# Patient Record
Sex: Male | Born: 1975 | Race: Black or African American | Hispanic: Yes | Marital: Single | Smoking: Current every day smoker
Health system: Southern US, Community
[De-identification: ages and names within clinical notes are randomized; demographics above are authoritative.]

---

## 2010-08-16 ENCOUNTER — Emergency Department (HOSPITAL_COMMUNITY)
Admission: EM | Admit: 2010-08-16 | Discharge: 2010-08-16 | Disposition: A | Discharge status: Home / Self Care | Payer: No Typology Code available for payment source | Attending: Emergency Medicine | Admitting: Emergency Medicine

## 2010-08-16 DIAGNOSIS — M542 Cervicalgia: Secondary | ICD-10-CM | POA: Insufficient documentation

## 2010-08-16 DIAGNOSIS — M545 Low back pain, unspecified: Secondary | ICD-10-CM | POA: Insufficient documentation

## 2010-08-16 DIAGNOSIS — S335XXA Sprain of ligaments of lumbar spine, initial encounter: Secondary | ICD-10-CM | POA: Insufficient documentation

## 2014-10-22 ENCOUNTER — Encounter: Payer: Self-pay | Admitting: Emergency Medicine

## 2014-10-22 ENCOUNTER — Emergency Department
Admission: EM | Admit: 2014-10-22 | Discharge: 2014-10-22 | Disposition: A | Discharge status: Home / Self Care | Payer: BLUE CROSS/BLUE SHIELD | Attending: Emergency Medicine | Admitting: Emergency Medicine

## 2014-10-22 DIAGNOSIS — K088 Other specified disorders of teeth and supporting structures: Secondary | ICD-10-CM | POA: Diagnosis present

## 2014-10-22 DIAGNOSIS — K029 Dental caries, unspecified: Secondary | ICD-10-CM | POA: Insufficient documentation

## 2014-10-22 MED ORDER — IBUPROFEN 800 MG PO TABS: 800.0000 mg | ORAL_TABLET | Freq: Three times a day (TID) | ORAL | Status: DC | PRN

## 2014-10-22 MED ORDER — TRAMADOL HCL 50 MG PO TABS: 50.0000 mg | ORAL_TABLET | Freq: Four times a day (QID) | ORAL | Status: DC | PRN

## 2014-10-22 MED ORDER — AMOXICILLIN 500 MG PO CAPS: 500.0000 mg | ORAL_CAPSULE | Freq: Three times a day (TID) | ORAL | Status: AC

## 2014-10-22 NOTE — ED Provider Notes (Signed)
[facility] Emergency [facility] Emergency Department Provider Note    ____________________________________________  Time seen: 4097 hrs.  I have reviewed the triage vital signs and the nursing notes.   HISTORY  Chief Complaint Dental Pain       HPI Calvin Barnes is a 39 y.o. male patient's complaint of onset of dental pain last night. States pain is located in the right lower molar. Patient's had a problem with this tooth in the past has been recommended by Simona Huh to have extracted. State he will follow with a dentist patient's rate the pain as a 10 over 10. She stated swelling around the tooth.Patient states the pain become worse eating. Patient state using Tylenol or Motrin following moderate relief. Patient denies any fever or chills.  History reviewed. No pertinent past medical history.  There are no active problems to display for this patient.   No past surgical history on file.  Current Outpatient Rx  Name  Route  Sig  Dispense  Refill  . amoxicillin (AMOXIL) 500 MG capsule   Oral   Take 1 capsule (500 mg total) by mouth 3 (three) times daily.   30 capsule   0   . ibuprofen (ADVIL,MOTRIN) 800 MG tablet   Oral   Take 1 tablet (800 mg total) by mouth every 8 (eight) hours as needed for moderate pain.   15 tablet   0   . traMADol (ULTRAM) 50 MG tablet   Oral   Take 1 tablet (50 mg total) by mouth every 6 (six) hours as needed for moderate pain.   12 tablet   0     Allergies Review of patient's allergies indicates no known allergies.  No family history on file.  Social History History  Substance Use Topics  . Smoking status: Not on file  . Smokeless tobacco: Not on file  . Alcohol Use: Not on file    Review of Systems  Constitutional: Negative for fever. Eyes: Negative for visual changes. ENT: Negative for sore throat. Positive for dental pain. Cardiovascular: Negative for chest pain. Respiratory: Negative for shortness of  breath. Gastrointestinal: Negative for abdominal pain, vomiting and diarrhea. Genitourinary: Negative for dysuria. Musculoskeletal: Negative for back pain. Skin: Negative for rash. Neurological: Negative for headaches, focal weakness or numbness. Psychiatric:Negative Endocrine:Negative Hematological/Lymphatic: Negative Allergic/Immunilogical: Negative  10-point ROS otherwise negative.  ____________________________________________   PHYSICAL EXAM:  VITAL SIGNS: ED Triage Vitals  Enc Vitals Group     BP 10/22/14 1017 136/79 mmHg     Pulse Rate 10/22/14 1017 99     Resp 10/22/14 1017 18     Temp 10/22/14 1017 98.7 F (37.1 C)     Temp Source 10/22/14 1017 Oral     SpO2 10/22/14 1017 97 %     Weight 10/22/14 1017 260 lb (117.935 kg)     Height 10/22/14 1017 6' 2"  (1.88 m)     Head Cir --      Peak Flow --      Pain Score 10/22/14 1020 10     Pain Loc --      Pain Edu? --      Excl. in [place]? --      Constitutional: Alert and oriented. Well appearing and in no distress. Eyes: Conjunctivae are normal. PERRL. Normal extraocular movements. ENT   Head: Normocephalic and atraumatic.   Nose: No congestion/rhinnorhea.   Mouth/Throat: Mucous membranes are moist. Mild edema around 231   Neck: No stridor. Hematological/Lymphatic/Immunilogical: No cervical lymphadenopathy. Cardiovascular: Normal  rate, regular rhythm. Normal and symmetric distal pulses are present in all extremities. No murmurs, rubs, or gallops. Respiratory: Normal respiratory effort without tachypnea nor retractions. Breath sounds are clear and equal bilaterally. No wheezes/rales/rhonchi. Gastrointestinal: Soft and nontender. No distention. No abdominal bruits. There is no CVA tenderness. Genitourinary: Not examined Musculoskeletal: Nontender with normal range of motion in all extremities. No joint effusions.  No lower extremity tenderness nor edema. Neurologic:  Normal speech and language. No gross  focal neurologic deficits are appreciated. Speech is normal. No gait instability. Skin:  Skin is warm, dry and intact. No rash noted. Psychiatric: Mood and affect are normal. Speech and behavior are normal. Patient exhibits appropriate insight and judgment.  ____________________________________________    LABS (pertinent positives/negatives)  None  ____________________________________________   EKG  None ____________________________________________    RADIOLOGY  None  ____________________________________________   PROCEDURES  Procedure(s) performed: None  Critical Care performed: No  ____________________________________________   INITIAL IMPRESSION / ASSESSMENT AND PLAN / ED COURSE  Pertinent labs & imaging results that were available during my care of the patient were reviewed by me and considered in my medical decision making (see chart for details). Dental pain   ____________________________________________   FINAL CLINICAL IMPRESSION(S) / ED DIAGNOSES  Final diagnoses:  Pain due to dental caries   OPTIONS FOR DENTAL FOLLOW UP CARE  [place] [facility] and [place] [email or web address] [phone])  [facility] [phone])  [place] [phone])  [place] [phone])  [facility] [phone]) This clinic caters to the indigent population and is on a lottery system. Location: [employer], [place], [place], [place] Clinic Hours: Wednesdays from 6pm - 9pm, patients seen by a lottery system. For dates, call or go to [email or web address] Services: Cleanings, fillings and simple extractions. Payment Options: DENTAL WORK IS FREE OF CHARGE. Bring proof of income or support. Best way to get seen: Arrive at 5:15  pm - this is a lottery, NOT first come/first serve, so arriving earlier will not increase your chances of being seen.     [facility] [phone] Select option 1 for emergencies   Location: [facility], [place], [address], [place] Clinic Hours: No walk-ins accepted - call the day before to schedule an appointment. Check in times are 9:30 am and 1:30 pm. Services: Simple extractions, temporary fillings, pulpectomy/pulp debridement, uncomplicated abscess drainage. Payment Options: PAYMENT IS DUE AT THE TIME OF SERVICE.  Fee is usually $100-200, additional surgical procedures (e.g. abscess drainage) may be extra. Cash, checks, Visa/MasterCard accepted.  Can file Medicaid if patient is covered for dental - patient should call case worker to check. No discount for [facility] patients. Best way to get seen: MUST call the day before and get onto the schedule. Can usually be seen the next 1-2 days. No walk-ins accepted.     [place] [phone]   Location: [place], [place] [facility] Hours: M, W, Th, F 8am or 1:30pm, Tues 9a or 1:30 - first come/first served. Services: Simple extractions, temporary fillings, uncomplicated abscess drainage.  You do not need to be an [facility] resident. Payment Options: PAYMENT IS DUE AT THE TIME OF SERVICE. Dental insurance, otherwise sliding scale - bring proof of income or support. Depending on income and treatment needed, cost is usually $50-200. Best  way to get seen: Arrive early as it is first come/first served.     [facility] [phone]   Location: [place] [facility] Hours: Mon-Thu 8a-5p Services: Most basic dental services including extractions and fillings. Payment Options: PAYMENT IS DUE AT THE TIME OF SERVICE. Sliding scale, up to 50% off - bring proof if income or  support. Medicaid with dental option accepted. Best way to get seen: Call to schedule an appointment, can usually be seen within 2 weeks OR they will try to see walk-ins - show up at [place] or 2p (you may have to wait).     [facility] [place] RESIDENTS ONLY   Location: [facility], [place] [address], [place], [place] [address] Clinic Hours: By appointment only. Monday - Thursday 8am-5pm, Friday 8am-12pm Services: Cleanings, fillings, extractions. Payment Options: PAYMENT IS DUE AT THE TIME OF SERVICE. Cash, Visa or MasterCard. Sliding scale - $30 minimum per service. Best way to get seen: Come in to office, complete packet and make an appointment - need proof of income or support monies for each household member and proof of [employer] residence. Usually takes about a month to get in.     [facility] [phone]   Location: [address], [place] Clinic Hours: Walk-in Urgent Care Dental Services are offered Monday-Friday mornings only. The numbers of emergencies accepted daily is limited to the number of providers available. Maximum 15 - Mondays, Wednesdays & Thursdays Maximum 10 - Tuesdays & Fridays Services: You do not need to be a [facility] resident to be seen for a dental emergency. Emergencies are defined as pain, swelling, abnormal bleeding, or dental trauma. Walkins will receive x-rays if needed. NOTE: Dental cleaning is not an emergency. Payment Options: PAYMENT IS DUE AT THE TIME OF SERVICE. Minimum co-pay is $40.00 for uninsured patients. Minimum co-pay is $3.00 for Medicaid with dental coverage. Dental Insurance is accepted and must be presented at time of visit. Medicare does not cover dental. Forms of payment: Cash, credit card, checks. Best way to get seen: If not previously registered with the clinic, walk-in dental registration begins at 7:15 am and is on a first  come/first serve basis. If previously registered with the clinic, call to make an appointment.     [facility] [place] ONLY   Location: [address], [place], [place] Clinic Hours: Mon-Thu 10a-2p Services: Extractions only! Payment Options: FREE (donations accepted) - bring proof of income or support Best way to get seen: Call and schedule an appointment OR come at 8am on the 1st Monday of every month (except for holidays) when it is first come/first served.     [facility] [phone]   Location: [place], [place] Clinic Hours: Friday mornings Services, Payment Options, Best way to get seen: Call for [place], PA-C 10/22/14 [place], PA-C 10/22/14 1129  Lavonia Drafts, MD 10/22/14 [phone]

## 2014-10-22 NOTE — ED Notes (Signed)
States is wisdom tooth

## 2014-10-22 NOTE — ED Notes (Addendum)
Pt states he came in today d/t concerns of his tooth getting infected. NAD noted at this time. Pt states he has had trouble eating d/t pain. Pt states he has talked to dentist about removal. Pt states he has a knott on R side of face from swelling.

## 2018-02-05 ENCOUNTER — Emergency Department: Payer: Managed Care, Other (non HMO)

## 2018-02-05 ENCOUNTER — Emergency Department
Admission: EM | Admit: 2018-02-05 | Discharge: 2018-02-05 | Disposition: A | Discharge status: Home / Self Care | Payer: Managed Care, Other (non HMO) | Attending: Emergency Medicine | Admitting: Emergency Medicine

## 2018-02-05 ENCOUNTER — Encounter: Payer: Self-pay | Admitting: Emergency Medicine

## 2018-02-05 ENCOUNTER — Other Ambulatory Visit: Payer: Self-pay

## 2018-02-05 DIAGNOSIS — R748 Abnormal levels of other serum enzymes: Secondary | ICD-10-CM | POA: Insufficient documentation

## 2018-02-05 DIAGNOSIS — R079 Chest pain, unspecified: Secondary | ICD-10-CM | POA: Insufficient documentation

## 2018-02-05 DIAGNOSIS — I10 Essential (primary) hypertension: Secondary | ICD-10-CM | POA: Diagnosis not present

## 2018-02-05 LAB — BASIC METABOLIC PANEL
ANION GAP: 9 (ref 5–15)
BUN: 22 mg/dL — ABNORMAL HIGH (ref 6–20)
CALCIUM: 9 mg/dL (ref 8.9–10.3)
CO2: 27 mmol/L (ref 22–32)
Chloride: 104 mmol/L (ref 98–111)
Creatinine, Ser: 0.93 mg/dL (ref 0.61–1.24)
GFR calc non Af Amer: >60 mL/min (ref >60)
Glucose, Bld: 108 mg/dL — ABNORMAL HIGH (ref 70–99)
Potassium: 4 mmol/L (ref 3.5–5.1)
Sodium: 140 mmol/L (ref 135–145)

## 2018-02-05 LAB — CBC
HCT: 42.6 % (ref 40.0–52.0)
HEMOGLOBIN: 14.8 g/dL (ref 13.0–18.0)
MCH: 29.8 pg (ref 26.0–34.0)
MCHC: 34.7 g/dL (ref 32.0–36.0)
MCV: 85.8 fL (ref 80.0–100.0)
PLATELETS: 283 10*3/uL (ref 150–440)
RBC: 4.96 MIL/uL (ref 4.40–5.90)
RDW: 14.1 % (ref 11.5–14.5)
WBC: 10.8 10*3/uL — ABNORMAL HIGH (ref 3.8–10.6)

## 2018-02-05 LAB — HEPATIC FUNCTION PANEL
ALT: 41 U/L (ref 0–44)
AST: 29 U/L (ref 15–41)
Albumin: 4.3 g/dL (ref 3.5–5.0)
Alkaline Phosphatase: 78 U/L (ref 38–126)
Bilirubin, Direct: 0.1 mg/dL (ref 0.0–0.2)
Indirect Bilirubin: 0.5 mg/dL (ref 0.3–0.9)
Total Bilirubin: 0.6 mg/dL (ref 0.3–1.2)
Total Protein: 8.1 g/dL (ref 6.5–8.1)

## 2018-02-05 LAB — TROPONIN I
Troponin I: <0.03 ng/mL (ref <0.03)
Troponin I: <0.03 ng/mL (ref <0.03)

## 2018-02-05 LAB — LIPASE, BLOOD: LIPASE: 118 U/L — AB (ref 11–51)

## 2018-02-05 MED ORDER — ASPIRIN 81 MG PO CHEW
324.0000 mg | CHEWABLE_TABLET | Freq: Once | ORAL | Status: AC
  Administered 2018-02-05: 324 mg via ORAL
  Filled 2018-02-05: qty 4

## 2018-02-05 NOTE — Discharge Instructions (Addendum)
You have been seen in the Emergency Department (ED) today for chest pain.  As we have discussed todays test results are normal, but you may require further testing.  Please follow up with the recommended doctor as instructed above in these documents regarding todays emergent visit and your recent symptoms to discuss further management.  Continue to take your regular medications. If you are not doing so already, please also take a daily baby aspirin (81 mg), at least until you follow up with your doctor.  Because your pain may be the result of musculoskeletal strain, we also encourage you to try taking ibuprofen 600 mg by mouth 3 times a day with meals for at least the next 5 days.  You may find this helps significantly with the pain you are experiencing when lifting heavy boxes.  Additionally, you can also take acetaminophen (Tylenol) 1000 mg up to 4 times per day along with the ibuprofen.  Since we are only seeing you this once in the ED, it is better not to start you on blood pressure medication at this since we do not know what is your baseline blood pressure.  This is something you should discuss either with your primary care provider or your new cardiologist, however.  As we discussed, your lipase level is also elevated today, which could mean that you have a mild case of pancreatitis.  However you have no other symptoms to go along with it.  I recommend you read through the information provided, avoid alcohol and fatty/greasy foods, and follow-up as recommended with the GI doctor.  Return to the emergency department if you develop new or worsening symptoms that concern you.  Also return to the Emergency Department (ED) if you experience any further chest pain/pressure/tightness, difficulty breathing, or sudden sweating, or other symptoms that concern you.

## 2018-02-05 NOTE — ED Triage Notes (Signed)
Pt c/o CP xfew days with SOB and dizziness worse with movement. NAD noted, VSS

## 2018-02-05 NOTE — ED Provider Notes (Signed)
[facility] Emergency Department Provider Note  ____________________________________________   First MD Initiated Contact with Patient 02/05/18 1029     (approximate)  I have reviewed the triage vital signs and the nursing notes.   HISTORY  Chief Complaint Chest Pain    HPI Calvin Barnes is a 42 y.o. male with no chronic medical history but who also reports that he does not go to the doctor regularly.  He presents for evaluation of 6 days of central mild to moderate dull chest pain that he experiences primarily when at work and lifting heavy boxes.  He says that he can usually lift them just fine from a table or shelf without any pain, but when he lifts them up off the floor and carries them around makes his chest hurt.  The chest pain goes away immediately after he puts down the boxes.  Sometimes the pain is accompanied with shortness of breath.  He denies nausea, vomiting, fever/chills, abdominal pain, and dysuria.  He has not experienced such symptoms in the past.  There is no particular incident that he experienced that makes him think he injured himself or sustained a trauma.  He smokes tobacco every day and says that he has no known diagnoses of hypertension or diabetes.  His mother had high blood pressure but no first-degree relatives of which he is aware have had heart attacks.  History reviewed. No pertinent past medical history.  There are no active problems to display for this patient.   History reviewed. No pertinent surgical history.  Prior to Admission medications   Medication Sig Start Date End Date Taking? Authorizing Provider  ibuprofen (ADVIL,MOTRIN) 800 MG tablet Take 1 tablet (800 mg total) by mouth every 8 (eight) hours as needed for moderate pain. 10/22/14   Sable Feil, PA-C  traMADol (ULTRAM) 50 MG tablet Take 1 tablet (50 mg total) by mouth every 6 (six) hours as needed for moderate pain. 10/22/14   Sable Feil, PA-C     Allergies Patient has no known allergies.  No family history on file.  Social History Social History   Tobacco Use  . Smoking status: Never Smoker  . Smokeless tobacco: Never Used  Substance Use Topics  . Alcohol use: Not Currently  . Drug use: Never    Review of Systems Constitutional: No fever/chills Eyes: No visual changes. ENT: No sore throat. Cardiovascular: exertional chest pain as described above Respiratory: occasional SOB as described above Gastrointestinal: No abdominal pain.  No nausea, no vomiting.  No diarrhea.  No constipation. Genitourinary: Negative for dysuria. Musculoskeletal: Negative for neck pain.  Negative for back pain. Integumentary: Negative for rash. Neurological: Negative for headaches, focal weakness or numbness.   ____________________________________________   PHYSICAL EXAM:  VITAL SIGNS: ED Triage Vitals  Enc Vitals Group     BP 02/05/18 0834 (!) 161/100     Pulse Rate 02/05/18 0834 74     Resp 02/05/18 0834 16     Temp 02/05/18 0834 98.1 F (36.7 C)     Temp Source 02/05/18 0834 Oral     SpO2 02/05/18 0834 96 %     Weight 02/05/18 0833 127 kg (280 lb)     Height 02/05/18 0833 1.854 m (6' 1" )     Head Circumference --      Peak Flow --      Pain Score 02/05/18 0833 4     Pain Loc --      Pain Edu? --  Excl. in [place]? --     Constitutional: Alert and oriented. Well appearing and in no acute distress. Eyes: Conjunctivae are normal.  Head: Atraumatic. Nose: No congestion/rhinnorhea. Mouth/Throat: Mucous membranes are moist. Neck: No stridor.  No meningeal signs.   Cardiovascular: Normal rate, regular rhythm. Good peripheral circulation. Grossly normal heart sounds.  Respiratory: Normal respiratory effort.  No retractions. Lungs CTAB. Gastrointestinal: Soft and nontender. No distention.  Musculoskeletal: No lower extremity tenderness nor edema. No gross deformities of extremities.  No tenderness to palation of anterior  chest wall.  No reproducible chest tenderness or left arm pain with ROM of LUE Neurologic:  Normal speech and language. No gross focal neurologic deficits are appreciated.  Skin:  Skin is warm, dry and intact. No rash noted. Psychiatric: Mood and affect are normal. Speech and behavior are normal.  ____________________________________________   LABS (all labs ordered are listed, but only abnormal results are displayed)  Labs Reviewed  BASIC METABOLIC PANEL - Abnormal; Notable for the following components:      Result Value   Glucose, Bld 108 (*)    BUN 22 (*)    All other components within normal limits  CBC - Abnormal; Notable for the following components:   WBC 10.8 (*)    All other components within normal limits  LIPASE, BLOOD - Abnormal; Notable for the following components:   Lipase 118 (*)    All other components within normal limits  TROPONIN I  HEPATIC FUNCTION PANEL  TROPONIN I   ____________________________________________  EKG  ED ECG REPORT I, Hinda Kehr, the attending physician, personally viewed and interpreted this ECG.  Date: 02/05/2018 EKG Time: 8:32 Rate: 76 Rhythm: normal sinus rhythm QRS Axis: normal Intervals: normal ST/T Wave abnormalities: normal Narrative Interpretation: no evidence of acute ischemia  ____________________________________________  RADIOLOGY I, Hinda Kehr, personally viewed and evaluated these images (plain radiographs) as part of my medical decision making, as well as reviewing the written report by the radiologist.  ED MD interpretation: No acute intrathoracic abnormalities on chest x-ray  Official radiology report(s): Dg Chest 2 View  Result Date: 02/05/2018 CLINICAL DATA:  Chest pain and shortness of breath EXAM: CHEST - 2 VIEW COMPARISON:  None. FINDINGS: Lungs are clear. Heart size and pulmonary vascularity are normal. No adenopathy. No pneumothorax. No bone lesions. IMPRESSION: No edema or consolidation.  Electronically Signed   By: Lowella Grip III M.D.   On: 02/05/2018 08:49    ____________________________________________   PROCEDURES  Critical Care performed: No   Procedure(s) performed:   Procedures   ____________________________________________   INITIAL IMPRESSION / ASSESSMENT AND PLAN / ED COURSE  As part of my medical decision making, I reviewed the following data within the [facility] notes reviewed and incorporated, Labs reviewed , EKG interpreted  and Radiograph reviewed     Differential diagnosis includes, but is not limited to, musculoskeletal chest pain, ACS including unstable angina, pneumonia, PE, aortic dissection.  Patient is very well-appearing in no acute distress.  His blood pressure is elevated but I suspect this is both baseline for him and likely exacerbated by being in the emergency department.  No infectious signs or symptoms and the symptoms have been present for about a week at this point.  However he says that they were slightly worse this morning which is what prompted him to come to the emergency department.  He is low risk for ACS based on his HEART score and is PERC negative.  His symptoms  only occur when he is lifting heavy boxes up from the floor, but not when he is lifting them from a shelf.  There is no reproducible tenderness to palpation or with range of motion today, however, but I still believe that muscular skeletal pain is most likely.  However this is a patient that likely has untreated hypertension, unknown hemoglobin A1c, and is a daily tobacco user.  I have scheduled a repeat troponin and had my usual customary undifferentiated chest pain discussion with the patient.  He is comfortable with the plan for discharge and outpatient follow-up with cardiology to discuss additional testing such as a stress test.  I have given him a full dose aspirin in the emergency department and I recommended that he take a baby  aspirin as well as some ibuprofen with meals to see if this will help for the probable chest wall pain.  He understands and agrees with the plan and also understands the usual return precautions.  Clinical Course as of Feb 06 1251  Thu Feb 05, 2018  1226 Second troponin is negative and the patient has been in no acute distress in the exam room.  Interestingly, however, his lipase has come back elevated at 118 with normal hepatic function panel.  I will reassess the patient again and palpate his abdomen once more but a mild pancreatitis could be the cause of his symptoms.   [CF]  7011 I reassessed the patient again and palpated his abdomen fully.  He has no tenderness to palpation even in the epigastrium.  He reports that he drank a pint of liquor "a few days ago", and I think that may have been actually what started some of his discomfort.  However, given that he has no tenderness to palpation, normal LFTs, no clinically significant leukocytosis, and only has pain when he lifts heavy boxes from the floor, I do not think he would benefit from a CT scan of the abdomen and pelvis or from hospitalization.  He is tolerating food and drink without difficulty and has had no vomiting or diarrhea.I had a discussion with him about the elevated lipase and the implications of it, provided food recommendations, suggested he avoid alcohol, and gave my usual customary return precautions.  He understands and agrees with the plan.   [CF]    Clinical Course User Index [CF] Hinda Kehr, MD    ____________________________________________  FINAL CLINICAL IMPRESSION(S) / ED DIAGNOSES  Final diagnoses:  Chest pain, unspecified type  Essential hypertension  Elevated lipase     MEDICATIONS GIVEN DURING THIS VISIT:  Medications  aspirin chewable tablet 324 mg (324 mg Oral Given 02/05/18 1151)     ED Discharge Orders    None       Note:  This document was prepared using Dragon voice recognition software  and may include unintentional dictation errors.    Hinda Kehr, MD 02/05/18 1252

## 2018-02-09 ENCOUNTER — Telehealth: Payer: Self-pay | Admitting: Gastroenterology

## 2018-02-09 NOTE — Telephone Encounter (Signed)
Pt is calling to schedule ed fu pt wants to find a specialist for pancriast  Not gastroenterologist he will call back if he changes his mind

## 2018-03-16 ENCOUNTER — Encounter: Payer: Self-pay | Admitting: Gastroenterology

## 2018-03-16 ENCOUNTER — Ambulatory Visit (INDEPENDENT_AMBULATORY_CARE_PROVIDER_SITE_OTHER): Payer: Managed Care, Other (non HMO) | Admitting: Gastroenterology

## 2018-03-16 VITALS — BP 150/99 | HR 98 | Ht 74.0 in | Wt 280.2 lb

## 2018-03-16 DIAGNOSIS — R748 Abnormal levels of other serum enzymes: Secondary | ICD-10-CM

## 2018-03-16 NOTE — Progress Notes (Signed)
Jonathon Bellows MD, MRCP(U.K) [address]  [place]  [place], [place] [address]  Main: [phone]  Fax: [phone]   Gastroenterology Consultation  Referring Provider:   ER Primary Care Physician:  Patient, No Pcp Per Primary Gastroenterologist:  Dr. Jonathon Bellows  Reason for Consultation:     Chest pain         HPI:   Anthoney Barnes is a 42 y.o. y/o male presented to the ER on 02/05/18 with 6 days of chest pain . It was worse on exertion. Cardiac causes were ruled out in the ER, his lipase was mildly elevated with normal hepatic function panel , he had consumed some alcohol a few days prior to the ER visit . He was discharged and asked to follow up with GI.    He says he feels well, very occasional left sided pain which is very minimal he states. He drinks occasional alcohol. The pain he was seen at the ER has not recurred. He has never had such a pain before He feels it may have been anxiety. No family history of heart issues. Can run up a flight of stairs with no issues of pain. Exertion does not cause any pain. Occasional heart burn. He had been taking some alleve before his ER visit. Takes it occasionally .    History reviewed. No pertinent past medical history.  History reviewed. No pertinent surgical history.  Prior to Admission medications   Not on File    History reviewed. No pertinent family history.   Social History   Tobacco Use  . Smoking status: Current Every Day Smoker    Packs/day: 2.00  . Smokeless tobacco: Never Used  Substance Use Topics  . Alcohol use: Yes  . Drug use: Never    Allergies as of 03/16/2018  . (No Known Allergies)    Review of Systems:    All systems reviewed and negative except where noted in HPI.   Physical Exam:  BP (!) 150/99   Pulse 98   Ht 6' 2"  (1.88 m)   Wt 280 lb 3.2 oz (127.1 kg)   BMI 35.98 kg/m  No LMP for male patient. Psych:  Alert and cooperative. Normal mood and affect. General:   Alert,  Well-developed,  well-nourished, pleasant and cooperative in NAD Head:  Normocephalic and atraumatic. Eyes:  Sclera clear, no icterus.   Conjunctiva pink. Ears:  Normal auditory acuity. Nose:  No deformity, discharge, or lesions. Mouth:  No deformity or lesions,oropharynx pink & moist. Neck:  Supple; no masses or thyromegaly. Lungs:  Respirations even and unlabored.  Clear throughout to auscultation.   No wheezes, crackles, or rhonchi. No acute distress. Heart:  Regular rate and rhythm; no murmurs, clicks, rubs, or gallops. Abdomen:  Normal bowel sounds.  No bruits.  Soft, non-tender and non-distended without masses, hepatosplenomegaly or hernias noted.  No guarding or rebound tenderness.    Neurologic:  Alert and oriented x3;  grossly normal neurologically. Skin:  Intact without significant lesions or rashes. No jaundice. Lymph Nodes:  No significant cervical adenopathy. Psych:  Alert and cooperative. Normal mood and affect.  Imaging Studies: No results found.  Assessment and Plan:   Calvin Barnes is a 42 y.o. y/o male has been referred for elevated lipase. No symptoms presently. Long term NSAID use .   Plan  1. Stop alleve.  2. Take occasional tylenol if needed 3. Obtain a PCP to establish care- BP is elevated needs to be evaluated.  4. Chest  discomfort does not sound cardiac  5. Elevated lipase likely from gastritis from NSAID use. Or alcohol use.  6. Stop smoking .   Follow up in PRN  Dr Jonathon Bellows MD,MRCP(U.K)

## 2019-11-26 ENCOUNTER — Emergency Department (HOSPITAL_COMMUNITY): Payer: Managed Care, Other (non HMO)

## 2019-11-26 ENCOUNTER — Encounter (HOSPITAL_COMMUNITY): Payer: Self-pay | Admitting: Emergency Medicine

## 2019-11-26 ENCOUNTER — Emergency Department (HOSPITAL_COMMUNITY)
Admission: EM | Admit: 2019-11-26 | Discharge: 2019-11-26 | Disposition: A | Discharge status: Home / Self Care | Payer: Managed Care, Other (non HMO) | Attending: Emergency Medicine | Admitting: Emergency Medicine

## 2019-11-26 DIAGNOSIS — R6883 Chills (without fever): Secondary | ICD-10-CM | POA: Insufficient documentation

## 2019-11-26 DIAGNOSIS — F17200 Nicotine dependence, unspecified, uncomplicated: Secondary | ICD-10-CM | POA: Diagnosis not present

## 2019-11-26 DIAGNOSIS — R002 Palpitations: Secondary | ICD-10-CM | POA: Diagnosis present

## 2019-11-26 DIAGNOSIS — R079 Chest pain, unspecified: Secondary | ICD-10-CM | POA: Insufficient documentation

## 2019-11-26 LAB — CBC WITH DIFFERENTIAL/PLATELET
Abs Immature Granulocytes: 0.02 10*3/uL (ref 0.00–0.07)
Basophils Absolute: 0.1 10*3/uL (ref 0.0–0.1)
Basophils Relative: 1 %
Eosinophils Absolute: 0.3 10*3/uL (ref 0.0–0.5)
Eosinophils Relative: 3 %
HCT: 46.1 % (ref 39.0–52.0)
Hemoglobin: 15.4 g/dL (ref 13.0–17.0)
Immature Granulocytes: 0 %
Lymphocytes Relative: 21 %
Lymphs Abs: 2 10*3/uL (ref 0.7–4.0)
MCH: 29.3 pg (ref 26.0–34.0)
MCHC: 33.4 g/dL (ref 30.0–36.0)
MCV: 87.6 fL (ref 80.0–100.0)
Monocytes Absolute: 0.7 10*3/uL (ref 0.1–1.0)
Monocytes Relative: 7 %
Neutro Abs: 6.5 10*3/uL (ref 1.7–7.7)
Neutrophils Relative %: 68 %
Platelets: 327 10*3/uL (ref 150–400)
RBC: 5.26 MIL/uL (ref 4.22–5.81)
RDW: 13.6 % (ref 11.5–15.5)
WBC: 9.6 10*3/uL (ref 4.0–10.5)
nRBC: 0 % (ref 0.0–0.2)

## 2019-11-26 LAB — BASIC METABOLIC PANEL
Anion gap: 10 (ref 5–15)
BUN: 22 mg/dL — ABNORMAL HIGH (ref 6–20)
CO2: 27 mmol/L (ref 22–32)
Calcium: 8.9 mg/dL (ref 8.9–10.3)
Chloride: 102 mmol/L (ref 98–111)
Creatinine, Ser: 0.88 mg/dL (ref 0.61–1.24)
GFR calc Af Amer: >60 mL/min (ref >60)
GFR calc non Af Amer: >60 mL/min (ref >60)
Glucose, Bld: 97 mg/dL (ref 70–99)
Potassium: 3.7 mmol/L (ref 3.5–5.1)
Sodium: 139 mmol/L (ref 135–145)

## 2019-11-26 LAB — RAPID URINE DRUG SCREEN, HOSP PERFORMED
Amphetamines: POSITIVE — AB
Barbiturates: NOT DETECTED
Benzodiazepines: NOT DETECTED
Cocaine: NOT DETECTED
Opiates: NOT DETECTED
Tetrahydrocannabinol: POSITIVE — AB

## 2019-11-26 LAB — TROPONIN I (HIGH SENSITIVITY)
Troponin I (High Sensitivity): 17 ng/L (ref <18)
Troponin I (High Sensitivity): 14 ng/L (ref <18)

## 2019-11-26 NOTE — Discharge Instructions (Addendum)
Your urine drug screen is positive for marijuana and amphetamines.  There is no way to tell how amphetamines got into your system but the rest of your lab work today has been reassuring.  Please follow-up with your PCP, continue to discuss with your HR department and try and keep her food separate from others.

## 2019-11-26 NOTE — ED Triage Notes (Addendum)
Per pt,, states he thinks someone is putting something in his food or drinks at work-states samething happened a few weeks ago-states he is having hot flashes, dry mouth and increased heart rate-denies any medical history, drugs-states he thinks coworker/s are doing this out of spite

## 2019-11-26 NOTE — ED Provider Notes (Signed)
[facility] DEPT Provider Note   CSN: [id number] Arrival date & time: 11/26/19  1041     History Chief Complaint  Patient presents with  . dry mouth/hot flashes    Pleasant Calvin Bann. is a 43 y.o. male.  Calvin Dante Dylon Correa. is a 44 y.o. male who is otherwise healthy, presents to the ED with concern that someone has been putting something in his food or drinks at work.  He states about a week ago he had an episode after eating his lunch at work where he developed chills, hot flashes, dry mouth and increased heart rate.  He denies any history of the same.  This happened a few weeks after one of his coworkers had slipping something into the drink overgrowing he was seen, and he became very concerned that this person may have put something in his food or drink at work causing the symptoms he had.  He had another episode today after eating at work where he started to have some palpitations felt very anxious and had chills and some chest tightness.  He reports symptoms are improving but are still present.  He states that he looked in the container of taking powder that he has at work and saw a large chunk of something that was white that did not look like the rest and worried that someone had put something in this.  He states that he spoke with someone with HR about this, but has not heard anything back about them, he was recommended to come and get checked out.  He reports he has a history of anxiety and panic attacks, and he has certainly felt more anxious with the symptoms.  Denies any lightheadedness or syncope.  No shortness of breath.  No fevers.  No abdominal pain, vomiting or diarrhea.  No headaches or dizziness.  He does report using marijuana intermittently but denies any other substance abuse.  No other aggravating or alleviating factors.        History reviewed. No pertinent past medical history.  There are no problems to display for this  patient.   History reviewed. No pertinent surgical history.     No family history on file.  Social History   Tobacco Use  . Smoking status: Current Every Day Smoker    Packs/day: 2.00  . Smokeless tobacco: Never Used  Vaping Use  . Vaping Use: Never used  Substance Use Topics  . Alcohol use: Yes  . Drug use: Never    Home Medications Prior to Admission medications   Not on File    Allergies    Patient has no known allergies.  Review of Systems   Review of Systems  Constitutional: Positive for chills. Negative for fever.  HENT: Negative.   Respiratory: Positive for chest tightness. Negative for cough and shortness of breath.   Cardiovascular: Positive for chest pain and palpitations. Negative for leg swelling.  Gastrointestinal: Negative for abdominal pain, diarrhea, nausea and vomiting.  Genitourinary: Negative for dysuria and frequency.  Musculoskeletal: Negative for arthralgias and myalgias.  Skin: Negative for color change and rash.    Physical Exam Updated Vital Signs BP (!) 187/123 (BP Location: Right Arm)   Pulse 87   Temp 98.1 F (36.7 C)   Resp 18   SpO2 100%   Physical Exam Vitals and nursing note reviewed.  Constitutional:      General: He is not in acute distress.    Appearance: Normal appearance. He is well-developed.  He is not ill-appearing or diaphoretic.     Comments: Appears anxious, but well-appearing and in no distress  HENT:     Head: Normocephalic and atraumatic.  Eyes:     General:        Right eye: No discharge.        Left eye: No discharge.  Cardiovascular:     Rate and Rhythm: Normal rate and regular rhythm.     Pulses: Normal pulses.     Heart sounds: Normal heart sounds. No murmur heard.  No friction rub. No gallop.   Pulmonary:     Effort: Pulmonary effort is normal. No respiratory distress.     Breath sounds: Normal breath sounds. No wheezing or rales.     Comments: Respirations equal and unlabored, patient able to  speak in full sentences, lungs clear to auscultation bilaterally Abdominal:     General: Bowel sounds are normal. There is no distension.     Palpations: Abdomen is soft. There is no mass.     Tenderness: There is no abdominal tenderness. There is no guarding.     Comments: Abdomen soft, nondistended, nontender to palpation in all quadrants without guarding or peritoneal signs  Musculoskeletal:        General: No deformity.     Cervical back: Neck supple.     Right lower leg: No edema.     Left lower leg: No edema.  Skin:    General: Skin is warm and dry.     Capillary Refill: Capillary refill takes less than 2 seconds.  Neurological:     Mental Status: He is alert.     Coordination: Coordination normal.     Comments: Speech is clear, able to follow commands Moves extremities without ataxia, coordination intact  Psychiatric:        Mood and Affect: Mood normal.        Behavior: Behavior normal.     ED Results / Procedures / Treatments   Labs (all labs ordered are listed, but only abnormal results are displayed) Labs Reviewed  RAPID URINE DRUG SCREEN, HOSP PERFORMED - Abnormal; Notable for the following components:      Result Value   Amphetamines POSITIVE (*)    Tetrahydrocannabinol POSITIVE (*)    All other components within normal limits  BASIC METABOLIC PANEL - Abnormal; Notable for the following components:   BUN 22 (*)    All other components within normal limits  CBC WITH DIFFERENTIAL/PLATELET  TROPONIN I (HIGH SENSITIVITY)  TROPONIN I (HIGH SENSITIVITY)    EKG None  Radiology DG Chest Port 1 View  Result Date: 11/26/2019 CLINICAL DATA:  LEFT side chest pain EXAM: PORTABLE CHEST 1 VIEW COMPARISON:  Portable exam 1148 hours compared to 02/05/2018 FINDINGS: Normal heart size, mediastinal contours, and pulmonary vascularity. Lungs clear. No pleural effusion or pneumothorax. Bones unremarkable. IMPRESSION: No acute abnormalities. Electronically Signed   By: Lavonia Dana M.D.   On: 11/26/2019 14:23    Procedures Procedures (including critical care time)  Medications Ordered in ED Medications - No data to display  ED Course  I have reviewed the triage vital signs and the nursing notes.  Pertinent labs & imaging results that were available during my care of the patient were reviewed by me and considered in my medical decision making (see chart for details).    MDM Rules/Calculators/A&P  44 year old male presents with palpitations, chest tightness and chills, he has had 2 episodes of this after eating at work, and is very suspicious that his specific coworker has been putting something in his food or drink at work.  Symptoms have been improving since here in the ED.  He does appear anxious.  Patient is hypertensive but vitals are otherwise normal and exam is unremarkable.  Will check basic labs, troponin, EKG and chest x-ray as well as UDS.  Had long discussion with patient that I will not be able to determine specifically if someone has been putting something in his food or drink.  He expresses understanding.  I have independently ordered, reviewed and interpreted all labs and imaging: CBC: No leukocytosis, normal hemoglobin BMP: No significant electrolyte derangements, normal renal function Troponin: Negative x2 EKG: CXR: No active cardiopulmonary disease.  UDS: Positive for THC and amphetamines, patient does report marijuana use but denies ever using amphetamines and is not prescribed any stimulants.  Discussed this result with him, and that we cannot generate how specifically he got amphetamines, discussed that they could be laced in marijuana or could have been placed in his food.  Asked patient to keep his feet separate from other employees and continue to discuss with HR.  At this time there does not appear to be any evidence of an acute emergency medical condition and the patient appears stable for discharge with  appropriate outpatient follow up.Diagnosis was discussed with patient who verbalizes understanding and is agreeable to discharge.   Final Clinical Impression(s) / ED Diagnoses Final diagnoses:  Palpitations    Rx / DC Orders ED Discharge Orders    None       Janet Berlin 11/26/19 1729    Lucrezia Starch, MD 11/30/19 [phone]

## 2021-06-08 ENCOUNTER — Emergency Department (HOSPITAL_COMMUNITY)
Admission: EM | Admit: 2021-06-08 | Discharge: 2021-06-09 | Disposition: A | Discharge status: Home / Self Care | Payer: Managed Care, Other (non HMO) | Attending: Emergency Medicine | Admitting: Emergency Medicine

## 2021-06-08 DIAGNOSIS — J029 Acute pharyngitis, unspecified: Secondary | ICD-10-CM | POA: Diagnosis present

## 2021-06-08 DIAGNOSIS — Z5321 Procedure and treatment not carried out due to patient leaving prior to being seen by health care provider: Secondary | ICD-10-CM | POA: Insufficient documentation

## 2021-06-09 ENCOUNTER — Other Ambulatory Visit: Payer: Self-pay

## 2021-06-09 ENCOUNTER — Encounter (HOSPITAL_COMMUNITY): Payer: Self-pay | Admitting: Emergency Medicine

## 2021-06-09 MED ORDER — KETOROLAC TROMETHAMINE 15 MG/ML IJ SOLN
15.0000 mg | Freq: Once | INTRAMUSCULAR | Status: AC
  Administered 2021-06-09: 01:00:00 15 mg via INTRAMUSCULAR
  Filled 2021-06-09 (×2): qty 1

## 2021-06-09 MED ORDER — DEXAMETHASONE SODIUM PHOSPHATE 10 MG/ML IJ SOLN
10.0000 mg | Freq: Once | INTRAMUSCULAR | Status: AC
  Administered 2021-06-09: 01:00:00 10 mg via INTRAMUSCULAR
  Filled 2021-06-09: qty 1

## 2021-06-09 NOTE — ED Provider Notes (Signed)
Emergency Medicine Provider Triage Evaluation Note  Calvin Barnes. , a 45 y.o. male  was evaluated in triage.  Pt complains of uvular edema which began suddenly a few hours ago. It has remained persistent, unchanged. No lip or tongue swallowing, SOB, difficulty swallowing. Describes the sensation and "uncomfortable" and "annoying". No new ingestions, exposures. Denies use of antihypertensives. No associated fevers, drooling, hives, SOB, cough, congestion, rhinorrhea.  Review of Systems  Positive: As above Negative: As above  Physical Exam  BP (!) 184/114 (BP Location: Right Arm)    Pulse 86    Temp 98.7 F (37.1 C) (Oral)    Resp 20    SpO2 99%  Gen:   Awake, no distress   Resp:  Normal effort  MSK:   Moves extremities without difficulty  Other:  Uvular edema noted. Tolerating secretions. No stridor.  Medical Decision Making  Medically screening exam initiated at 12:30 AM.  Appropriate orders placed.  Calvin Barnes. was informed that the remainder of the evaluation will be completed by another provider, this initial triage assessment does not replace that evaluation, and the importance of remaining in the ED until their evaluation is complete.  [facility] - will give IM Decadron, Toradol to assess for symptomatic improvement. No present concern for anaphylaxis.    Antonietta Breach, PA-C 06/09/21 0033    Fatima Blank, MD 06/10/21 [phone]

## 2021-06-09 NOTE — ED Triage Notes (Signed)
Pt reported to ED with c/o sore throat since earlier in the day. Denies any cold/flu-like symptoms, only expresses concerns for swelling of uvula. Airway patent at time of triage with no distress noted.

## 2021-11-14 ENCOUNTER — Telehealth (HOSPITAL_COMMUNITY): Payer: Self-pay | Admitting: Vascular Surgery

## 2021-11-14 NOTE — Telephone Encounter (Signed)
Pt is not appropriate for the HF clinic

## 2021-12-01 IMAGING — DX DG CHEST 1V PORT
1 series · 1 of 1 positions shown · non-contrast
Comparison: Portable exam 8847 hours compared to 02/05/2018

CLINICAL DATA: LEFT side chest pain

EXAM:
PORTABLE CHEST 1 VIEW

[chest ap]
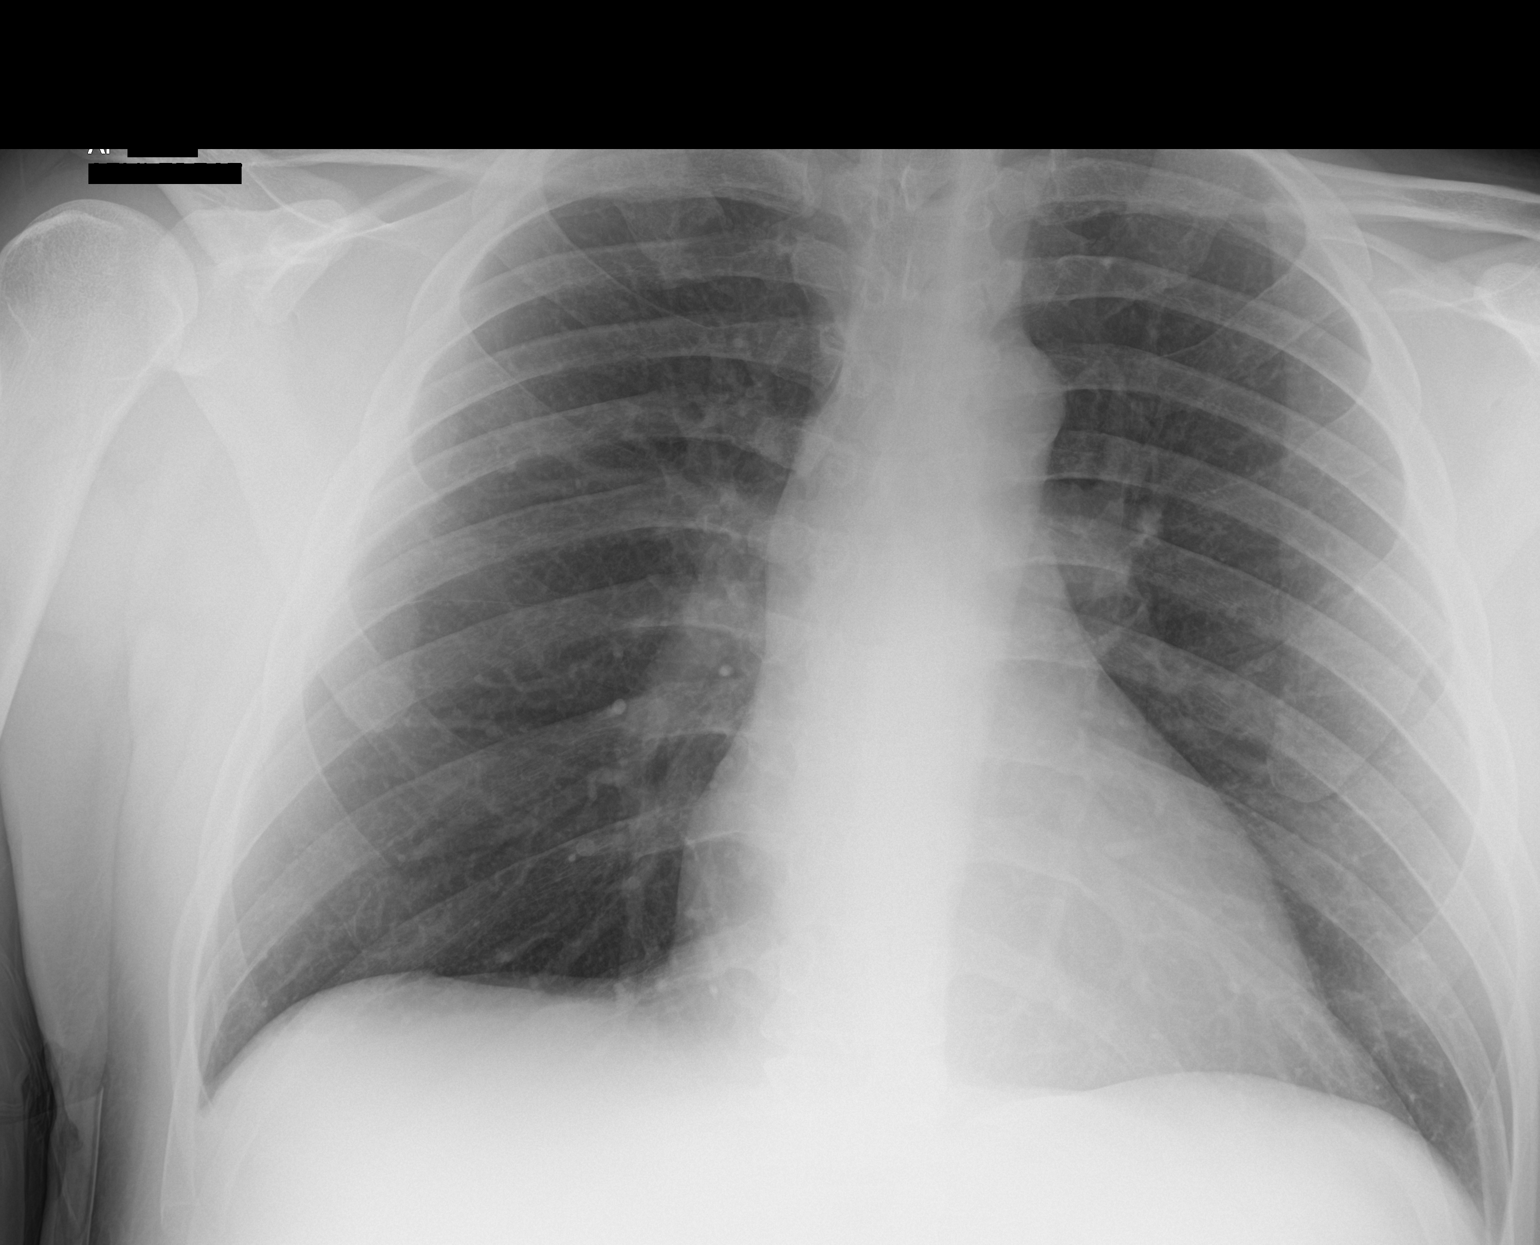

[1 of 1 positions shown; findings below may reference images not displayed]

FINDINGS: Normal heart size, mediastinal contours, and pulmonary vascularity.

Lungs clear.

No pleural effusion or pneumothorax.

Bones unremarkable.
IMPRESSION: No acute abnormalities.

## 2022-05-06 ENCOUNTER — Other Ambulatory Visit: Payer: Self-pay | Admitting: Internal Medicine

## 2022-07-02 ENCOUNTER — Encounter (HOSPITAL_COMMUNITY): Payer: Self-pay

## 2022-07-02 ENCOUNTER — Ambulatory Visit (HOSPITAL_COMMUNITY): Admit: 2022-07-02 | Payer: Self-pay | Admitting: Gastroenterology

## 2022-07-02 SURGERY — COLONOSCOPY WITH PROPOFOL
Anesthesia: Monitor Anesthesia Care
